# Patient Record
Sex: Female | Born: 1995 | Race: Black or African American | Hispanic: No | Marital: Single | Smoking: Never smoker
Health system: Southern US, Community
[De-identification: ages and names within clinical notes are randomized; demographics above are authoritative.]

## PROBLEM LIST (undated history)

## (undated) ENCOUNTER — Inpatient Hospital Stay (HOSPITAL_COMMUNITY): Payer: Self-pay

## (undated) DIAGNOSIS — Z789 Other specified health status: Secondary | ICD-10-CM

## (undated) DIAGNOSIS — I10 Essential (primary) hypertension: Secondary | ICD-10-CM

## (undated) HISTORY — DX: Essential (primary) hypertension: I10

## (undated) HISTORY — PX: NO PAST SURGERIES: SHX2092

---
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
## 2018-05-23 ENCOUNTER — Other Ambulatory Visit: Payer: Self-pay | Admitting: Family Medicine

## 2018-05-23 ENCOUNTER — Ambulatory Visit (INDEPENDENT_AMBULATORY_CARE_PROVIDER_SITE_OTHER): Payer: BLUE CROSS/BLUE SHIELD | Admitting: Family Medicine

## 2018-05-23 ENCOUNTER — Encounter: Payer: Self-pay | Admitting: Family Medicine

## 2018-05-23 VITALS — BP 110/76 | HR 62 | Temp 98.5°F | Ht 62.0 in | Wt 115.0 lb

## 2018-05-23 DIAGNOSIS — N281 Cyst of kidney, acquired: Secondary | ICD-10-CM | POA: Diagnosis not present

## 2018-05-23 DIAGNOSIS — Z862 Personal history of diseases of the blood and blood-forming organs and certain disorders involving the immune mechanism: Secondary | ICD-10-CM

## 2018-05-23 DIAGNOSIS — H6123 Impacted cerumen, bilateral: Secondary | ICD-10-CM

## 2018-05-23 DIAGNOSIS — Z3009 Encounter for other general counseling and advice on contraception: Secondary | ICD-10-CM

## 2018-05-23 DIAGNOSIS — Z7689 Persons encountering health services in other specified circumstances: Secondary | ICD-10-CM

## 2018-05-23 LAB — CBC
HCT: 30.7 % — ABNORMAL LOW (ref 36.0–46.0)
Hemoglobin: 9.7 g/dL — ABNORMAL LOW (ref 12.0–15.0)
MCHC: 31.4 g/dL (ref 30.0–36.0)
MCV: 78.2 fl (ref 78.0–100.0)
Platelets: 303 10*3/uL (ref 150.0–400.0)
RBC: 3.93 Mil/uL (ref 3.87–5.11)
RDW: 18 % — AB (ref 11.5–14.6)
WBC: 4.8 10*3/uL (ref 4.5–10.5)

## 2018-05-23 LAB — BASIC METABOLIC PANEL
BUN: 9 mg/dL (ref 6–23)
CALCIUM: 9.1 mg/dL (ref 8.4–10.5)
CO2: 26 mEq/L (ref 19–32)
Chloride: 105 mEq/L (ref 96–112)
Creatinine, Ser: 0.64 mg/dL (ref 0.40–1.20)
GFR: 142.63 mL/min (ref >60.00)
Glucose, Bld: 80 mg/dL (ref 70–99)
Potassium: 4.1 mEq/L (ref 3.5–5.1)
Sodium: 138 mEq/L (ref 135–145)

## 2018-05-23 NOTE — Patient Instructions (Signed)
Earwax Buildup, Adult The ears produce a substance called earwax that helps keep bacteria out of the ear and protects the skin in the ear canal. Occasionally, earwax can build up in the ear and cause discomfort or hearing loss. What increases the risk? This condition is more likely to develop in people who:  Are female.  Are elderly.  Naturally produce more earwax.  Clean their ears often with cotton swabs.  Use earplugs often.  Use in-ear headphones often.  Wear hearing aids.  Have narrow ear canals.  Have earwax that is overly thick or sticky.  Have eczema.  Are dehydrated.  Have excess hair in the ear canal. What are the signs or symptoms? Symptoms of this condition include:  Reduced or muffled hearing.  A feeling of fullness in the ear or feeling that the ear is plugged.  Fluid coming from the ear.  Ear pain.  Ear itch.  Ringing in the ear.  Coughing.  An obvious piece of earwax that can be seen inside the ear canal. How is this diagnosed? This condition may be diagnosed based on:  Your symptoms.  Your medical history.  An ear exam. During the exam, your health care provider will look into your ear with an instrument called an otoscope. You may have tests, including a hearing test. How is this treated? This condition may be treated by:  Using ear drops to soften the earwax.  Having the earwax removed by a health care provider. The health care provider may: ? Flush the ear with water. ? Use an instrument that has a loop on the end (curette). ? Use a suction device.  Surgery to remove the wax buildup. This may be done in severe cases. Follow these instructions at home:   Take over-the-counter and prescription medicines only as told by your health care provider.  Do not put any objects, including cotton swabs, into your ear. You can clean the opening of your ear canal with a washcloth or facial tissue.  Follow instructions from your health care  provider about cleaning your ears. Do not over-clean your ears.  Drink enough fluid to keep your urine clear or pale yellow. This will help to thin the earwax.  Keep all follow-up visits as told by your health care provider. If earwax builds up in your ears often or if you use hearing aids, consider seeing your health care provider for routine, preventive ear cleanings. Ask your health care provider how often you should schedule your cleanings.  If you have hearing aids, clean them according to instructions from the manufacturer and your health care provider. Contact a health care provider if:  You have ear pain.  You develop a fever.  You have blood, pus, or other fluid coming from your ear.  You have hearing loss.  You have ringing in your ears that does not go away.  Your symptoms do not improve with treatment.  You feel like the room is spinning (vertigo). Summary  Earwax can build up in the ear and cause discomfort or hearing loss.  The most common symptoms of this condition include reduced or muffled hearing and a feeling of fullness in the ear or feeling that the ear is plugged.  This condition may be diagnosed based on your symptoms, your medical history, and an ear exam.  This condition may be treated by using ear drops to soften the earwax or by having the earwax removed by a health care provider.  Do not put any   objects, including cotton swabs, into your ear. You can clean the opening of your ear canal with a washcloth or facial tissue. This information is not intended to replace advice given to you by your health care provider. Make sure you discuss any questions you have with your health care provider. Document Released: 05/31/2004 Document Revised: 04/04/2017 Document Reviewed: 07/04/2016 Elsevier Interactive Patient Education  2019 [employer].  Contraception Choices Contraception, also called birth control, refers to methods or devices that prevent  pregnancy. Hormonal methods Contraceptive implant  A contraceptive implant is a thin, plastic tube that contains a hormone. It is inserted into the upper part of the arm. It can remain in place for up to 3 years. Progestin-only injections Progestin-only injections are injections of progestin, a synthetic form of the hormone progesterone. They are given every 3 months by a health care provider. Birth control pills  Birth control pills are pills that contain hormones that prevent pregnancy. They must be taken once a day, preferably at the same time each day. Birth control patch  The birth control patch contains hormones that prevent pregnancy. It is placed on the skin and must be changed once a week for three weeks and removed on the fourth week. A prescription is needed to use this method of contraception. Vaginal ring  A vaginal ring contains hormones that prevent pregnancy. It is placed in the vagina for three weeks and removed on the fourth week. After that, the process is repeated with a new ring. A prescription is needed to use this method of contraception. Emergency contraceptive Emergency contraceptives prevent pregnancy after unprotected sex. They come in pill form and can be taken up to 5 days after sex. They work best the sooner they are taken after having sex. Most emergency contraceptives are available without a prescription. This method should not be used as your only form of birth control. Barrier methods Female condom  A female condom is a thin sheath that is worn over the penis during sex. Condoms keep sperm from going inside a woman's body. They can be used with a spermicide to increase their effectiveness. They should be disposed after a single use. Female condom  A female condom is a soft, loose-fitting sheath that is put into the vagina before sex. The condom keeps sperm from going inside a woman's body. They should be disposed after a single use. Diaphragm  A diaphragm is a  soft, dome-shaped barrier. It is inserted into the vagina before sex, along with a spermicide. The diaphragm blocks sperm from entering the uterus, and the spermicide kills sperm. A diaphragm should be left in the vagina for 6-8 hours after sex and removed within 24 hours. A diaphragm is prescribed and fitted by a health care provider. A diaphragm should be replaced every 1-2 years, after giving birth, after gaining more than 15 lb (6.8 kg), and after pelvic surgery. Cervical cap  A cervical cap is a round, soft latex or plastic cup that fits over the cervix. It is inserted into the vagina before sex, along with spermicide. It blocks sperm from entering the uterus. The cap should be left in place for 6-8 hours after sex and removed within 48 hours. A cervical cap must be prescribed and fitted by a health care provider. It should be replaced every 2 years. Sponge  A sponge is a soft, circular piece of polyurethane foam with spermicide on it. The sponge helps block sperm from entering the uterus, and the spermicide kills sperm.  To use it, you make it wet and then insert it into the vagina. It should be inserted before sex, left in for at least 6 hours after sex, and removed and thrown away within 30 hours. Spermicides Spermicides are chemicals that kill or block sperm from entering the cervix and uterus. They can come as a cream, jelly, suppository, foam, or tablet. A spermicide should be inserted into the vagina with an applicator at least 10-15 minutes before sex to allow time for it to work. The process must be repeated every time you have sex. Spermicides do not require a prescription. Intrauterine contraception Intrauterine device (IUD) An IUD is a T-shaped device that is put in a woman's uterus. There are two types:  Hormone IUD.This type contains progestin, a synthetic form of the hormone progesterone. This type can stay in place for 3-5 years.  Copper IUD.This type is wrapped in copper wire. It  can stay in place for 10 years.  Permanent methods of contraception Female tubal ligation In this method, a woman's fallopian tubes are sealed, tied, or blocked during surgery to prevent eggs from traveling to the uterus. Hysteroscopic sterilization In this method, a small, flexible insert is placed into each fallopian tube. The inserts cause scar tissue to form in the fallopian tubes and block them, so sperm cannot reach an egg. The procedure takes about 3 months to be effective. Another form of birth control must be used during those 3 months. Female sterilization This is a procedure to tie off the tubes that carry sperm (vasectomy). After the procedure, the man can still ejaculate fluid (semen). Natural planning methods Natural family planning In this method, a couple does not have sex on days when the woman could become pregnant. Calendar method This means keeping track of the length of each menstrual cycle, identifying the days when pregnancy can happen, and not having sex on those days. Ovulation method In this method, a couple avoids sex during ovulation. Symptothermal method This method involves not having sex during ovulation. The woman typically checks for ovulation by watching changes in her temperature and in the consistency of cervical mucus. Post-ovulation method In this method, a couple waits to have sex until after ovulation. Summary  Contraception, also called birth control, means methods or devices that prevent pregnancy.  Hormonal methods of contraception include implants, injections, pills, patches, vaginal rings, and emergency contraceptives.  Barrier methods of contraception can include female condoms, female condoms, diaphragms, cervical caps, sponges, and spermicides.  There are two types of IUDs (intrauterine devices). An IUD can be put in a woman's uterus to prevent pregnancy for 3-5 years.  Permanent sterilization can be done through a procedure for males,  females, or both.  Natural family planning methods involve not having sex on days when the woman could become pregnant. This information is not intended to replace advice given to you by your health care provider. Make sure you discuss any questions you have with your health care provider. Document Released: 04/23/2005 Document Revised: 04/25/2017 Document Reviewed: 05/26/2016 Elsevier Interactive Patient Education  2019 [employer]

## 2018-05-23 NOTE — Progress Notes (Signed)
Patient presents to clinic today to establish care.  SUBJECTIVE: PMH:  Pt is a 23 yo female with pmh sig for h/o anemia and renal cyst.  Pt was previously seen by her Pediatrician.  H/o renal cyst: -seen of imaging after pt had work up for HTN  [place] [facility] [facility] -pt states during last check everything was stable -she has not had any recent f/u. -denies CP, dysuria, back pain, urinary retention  H/o anemia: -iron deficiency anemia in the past -was on iron supplements -denies chills, fatigue, palpitations  Fertility concerns: -pt inquires if she is able to become pregnant as she feels it should have happened by now. -pt endorses multiple episodes of unprotected sex -using pull out method, but does not wish to become pregnant at this time. -is not on birth control. -Endorses regular menses monthly.  Menses last 5 days. -LMP ended 05/17/18. -Denies increased cramping or heavy bleeding.  Allergies: nkda  Social hx: Pt is single.  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  Pt denies EtOH, tobacco, or drug use.   History reviewed. No pertinent past medical history.  History reviewed. No pertinent surgical history.  No current outpatient medications on file prior to visit.   No current facility-administered medications on file prior to visit.     No Known Allergies  History reviewed. No pertinent family history.  Social History   Socioeconomic History  . Marital status: Single    Spouse name: Not on file  . Number of children: Not on file  . Years of education: Not on file  . Highest education level: Not on file  Occupational History  . Not on file  Social Needs  . Financial resource strain: Not on file  . Food insecurity:    Worry: Not on file    Inability: Not on file  . Transportation needs:    Medical: Not on file    Non-medical: Not on file  Tobacco Use  . Smoking status: Never Smoker  .  Smokeless tobacco: Never Used  Substance and Sexual Activity  . Alcohol use: Never    Frequency: Never  . Drug use: Never  . Sexual activity: Yes  Lifestyle  . Physical activity:    Days per week: Not on file    Minutes per session: Not on file  . Stress: Not on file  Relationships  . Social connections:    Talks on phone: Not on file    Gets together: Not on file    Attends religious service: Not on file    Active member of club or organization: Not on file    Attends meetings of clubs or organizations: Not on file    Relationship status: Not on file  . Intimate partner violence:    Fear of current or ex partner: Not on file    Emotionally abused: Not on file    Physically abused: Not on file    Forced sexual activity: Not on file  Other Topics Concern  . Not on file  Social History Narrative  . Not on file    ROS General: Denies fever, chills, night sweats, changes in weight, changes in appetite HEENT: Denies headaches, ear pain, changes in vision, rhinorrhea, sore throat CV: Denies CP, palpitations, SOB, orthopnea Pulm: Denies SOB, cough, wheezing GI: Denies abdominal pain, nausea, vomiting, diarrhea, constipation GU: Denies dysuria, hematuria, frequency, vaginal discharge Msk: Denies muscle cramps, joint pains Neuro:  Denies weakness, numbness, tingling Skin: Denies rashes, bruising Psych: Denies depression, anxiety, hallucinations  BP 110/76 (BP Location: Left Arm, Patient Position: Sitting, Cuff Size: Normal)   Pulse 62   Temp 98.5 F (36.9 C) (Oral)   Ht 5\' 2"  (1.575 m)   Wt 115 lb (52.2 kg)   LMP 05/17/2018 (Exact Date)   SpO2 97%   BMI 21.03 kg/m   Physical Exam Gen. Pleasant, well developed, well-nourished, in NAD HEENT - [place]/AT, PERRL, no scleral icterus, no nasal drainage Lungs: no use of accessory muscles, CTAB, no wheezes, rales or rhonchi Cardiovascular: RRR, No r/g/m, no peripheral edema Neuro:  A&Ox3, CN II-XII intact, normal gait Skin:  Warm,  dry, intact, no lesions  No results found for this or any previous visit (from the past 2160 hour(s)).  Assessment/Plan: Birth control counseling -Discussed safe sex practices including wearing condoms -Discussed birth control options.  Patient to review options and decide on birth control method -Patient reassured about not becoming pregnant thus far. -Given multiple handouts  Renal cyst, left  -BP controlled.  Not on medication -No recent follow-up imaging -We will obtain labs today. -Consider repeat imaging/follow-up with nephrology. - Plan: CBC (no diff), Basic metabolic panel  Bilateral impacted cerumen -Consent obtained.  Bilateral ears irrigated.  Patient tolerated procedure well. -Given handout -Consider Debrox eardrops.  History of anemia  - Plan: CBC (no diff)  Encounter to establish care -We reviewed the PMH, PSH, FH, SH, Meds and Allergies. -We provided refills for any medications we will prescribe as needed. -We addressed current concerns per orders and patient instructions. -We have asked for records for pertinent exams, studies, vaccines and notes from previous providers. -We have advised patient to follow up per instructions below.  F/u prn for CPE  Abbe [place], MD

## 2018-05-30 ENCOUNTER — Other Ambulatory Visit: Payer: Self-pay | Admitting: Family Medicine

## 2018-05-30 MED ORDER — FERROUS SULFATE 325 (65 FE) MG PO TABS: 325.0000 mg | ORAL_TABLET | Freq: Every day | ORAL | 0 refills | Status: DC

## 2018-06-13 ENCOUNTER — Encounter: Payer: BLUE CROSS/BLUE SHIELD | Admitting: Family Medicine

## 2018-12-24 ENCOUNTER — Encounter: Payer: BLUE CROSS/BLUE SHIELD | Admitting: Family Medicine

## 2019-01-01 ENCOUNTER — Encounter: Payer: Self-pay | Admitting: Family Medicine

## 2019-01-01 ENCOUNTER — Other Ambulatory Visit: Payer: Self-pay

## 2019-01-01 ENCOUNTER — Ambulatory Visit (INDEPENDENT_AMBULATORY_CARE_PROVIDER_SITE_OTHER): Payer: BC Managed Care – PPO | Admitting: Family Medicine

## 2019-01-01 VITALS — BP 110/78 | HR 70 | Temp 98.0°F | Wt 113.0 lb

## 2019-01-01 DIAGNOSIS — Z0000 Encounter for general adult medical examination without abnormal findings: Secondary | ICD-10-CM

## 2019-01-01 NOTE — Progress Notes (Signed)
Subjective:     Jill Peterson is a 23 y.o. female and is here for a comprehensive physical exam. The patient reports no problems.  Pt started a new job, now at [employer].  States the hours are longer so she feels tired.  Otherwise doing well.  Social History   Socioeconomic History  . Marital status: Single    Spouse name: Not on file  . Number of children: Not on file  . Years of education: Not on file  . Highest education level: Not on file  Occupational History  . Not on file  Social Needs  . Financial resource strain: Not on file  . Food insecurity    Worry: Not on file    Inability: Not on file  . Transportation needs    Medical: Not on file    Non-medical: Not on file  Tobacco Use  . Smoking status: Never Smoker  . Smokeless tobacco: Never Used  Substance and Sexual Activity  . Alcohol use: Never    Frequency: Never  . Drug use: Never  . Sexual activity: Yes  Lifestyle  . Physical activity    Days per week: Not on file    Minutes per session: Not on file  . Stress: Not on file  Relationships  . Social Herbalist on phone: Not on file    Gets together: Not on file    Attends religious service: Not on file    Active member of club or organization: Not on file    Attends meetings of clubs or organizations: Not on file    Relationship status: Not on file  . Intimate partner violence    Fear of current or ex partner: Not on file    Emotionally abused: Not on file    Physically abused: Not on file    Forced sexual activity: Not on file  Other Topics Concern  . Not on file  Social History Narrative  . Not on file   Health Maintenance  Topic Date Due  . CHLAMYDIA SCREENING  01/12/2013  . HIV Screening  01/12/2013  . TETANUS/TDAP  01/12/2017  . INFLUENZA VACCINE  12/06/2018    The following portions of the patient's history were reviewed and updated as appropriate: allergies, current medications, past family history, past medical  history, past social history, past surgical history and problem list.  Review of Systems Pertinent items noted in HPI and remainder of comprehensive ROS otherwise negative.   Objective:    BP 110/78 (BP Location: Left Arm, Patient Position: Sitting, Cuff Size: Normal)   Pulse 70   Temp 98 F (36.7 C) (Temporal)   Wt 113 lb (51.3 kg)   LMP 12/26/2018 (Exact Date)   SpO2 98%   BMI 20.67 kg/m  General appearance: alert, cooperative and no distress Head: Normocephalic, without obvious abnormality, atraumatic Eyes: conjunctivae/corneas clear. PERRL, EOM's intact. Fundi benign. Ears: normal TM's and external ear canals both ears Nose: Nares normal. Septum midline. Mucosa normal. No drainage or sinus tenderness. Throat: lips, mucosa, and tongue normal; teeth and gums normal Neck: no adenopathy, no carotid bruit, no JVD, supple, symmetrical, trachea midline and thyroid not enlarged, symmetric, no tenderness/mass/nodules Lungs: clear to auscultation bilaterally Heart: regular rate and rhythm, S1, S2 normal, no murmur, click, rub or gallop Abdomen: soft, non-tender; bowel sounds normal; no masses,  no organomegaly Extremities: extremities normal, atraumatic, no cyanosis or edema Pulses: 2+ and symmetric Skin: Skin color, texture, turgor normal. No rashes  or lesions Lymph nodes: Cervical, supraclavicular, and axillary nodes normal. Neurologic: Alert and oriented X 3, normal strength and tone. Normal symmetric reflexes. Normal coordination and gait    Assessment:    Healthy female exam.      Plan:     Anticipatory guidance given including wearing seatbelts, smoke detectors in the home, increasing physical activity, increasing p.o. intake of water and vegetables. -labs from 05/2018 reviewed -pap-not yet indicated -given handout -offer influenza vaccine. -next CPE in 1 yr See After Visit Summary for Counseling Recommendations    F/u prn  Abbe AmsterdamShannon Nathifa Ritthaler, MD

## 2019-01-01 NOTE — Patient Instructions (Signed)
Preventive Care 18-21 Years Old, Female Preventive care refers to lifestyle choices and visits with your health care provider that can promote health and wellness. At this stage in your life, you may start seeing a primary care physician instead of a pediatrician. Your health care is now your responsibility. Preventive care for young adults includes:  A yearly physical exam. This is also called an annual wellness visit.  Regular dental and eye exams.  Immunizations.  Screening for certain conditions.  Healthy lifestyle choices, such as diet and exercise. What can I expect for my preventive care visit? Physical exam Your health care provider may check:  Height and weight. These may be used to calculate body mass index (BMI), which is a measurement that tells if you are at a healthy weight.  Heart rate and blood pressure.  Body temperature. Counseling Your health care provider may ask you questions about:  Past medical problems and family medical history.  Alcohol, tobacco, and drug use.  Home and relationship well-being.  Access to firearms.  Emotional well-being.  Diet, exercise, and sleep habits.  Sexual activity and sexual health.  Method of birth control.  Menstrual cycle.  Pregnancy history. What immunizations do I need?  Influenza (flu) vaccine  This is recommended every year. Tetanus, diphtheria, and pertussis (Tdap) vaccine  You may need a Td booster every 10 years. Varicella (chickenpox) vaccine  You may need this vaccine if you have not already been vaccinated. Human papillomavirus (HPV) vaccine  If recommended by your health care provider, you may need three doses over 6 months. Measles, mumps, and rubella (MMR) vaccine  You may need at least one dose of MMR. You may also need a second dose. Meningococcal conjugate (MenACWY) vaccine  One dose is recommended if you are 19-21 years old and a first-year college student living in a residence hall,  or if you have one of several medical conditions. You may also need additional booster doses. Pneumococcal conjugate (PCV13) vaccine  You may need this if you have certain conditions and were not previously vaccinated. Pneumococcal polysaccharide (PPSV23) vaccine  You may need one or two doses if you smoke cigarettes or if you have certain conditions. Hepatitis A vaccine  You may need this if you have certain conditions or if you travel or work in places where you may be exposed to hepatitis A. Hepatitis B vaccine  You may need this if you have certain conditions or if you travel or work in places where you may be exposed to hepatitis B. Haemophilus influenzae type b (Hib) vaccine  You may need this if you have certain risk factors. You may receive vaccines as individual doses or as more than one vaccine together in one shot (combination vaccines). Talk with your health care provider about the risks and benefits of combination vaccines. What tests do I need? Blood tests  Lipid and cholesterol levels. These may be checked every 5 years starting at age 20.  Hepatitis C test.  Hepatitis B test. Screening  Pelvic exam and Pap test. This may be done every 3 years starting at age 21.  Sexually transmitted disease (STD) testing, if you are at risk.  BRCA-related cancer screening. This may be done if you have a family history of breast, ovarian, tubal, or peritoneal cancers. Other tests  Tuberculosis skin test.  Vision and hearing tests.  Skin exam.  Breast exam. Follow these instructions at home: Eating and drinking   Eat a diet that includes fresh fruits and   vegetables, whole grains, lean protein, and low-fat dairy products.  Drink enough fluid to keep your urine pale yellow.  Do not drink alcohol if: ? Your health care provider tells you not to drink. ? You are pregnant, may be pregnant, or are planning to become pregnant. ? You are under the legal drinking age. In the  [place] the legal drinking age is 21.  If you drink alcohol: ? Limit how much you have to 0-1 drink a day. ? Be aware of how much alcohol is in your drink. In the [place] one drink equals one 12 oz bottle of beer (355 mL), one 5 oz glass of wine (148 mL), or one 1 oz glass of hard liquor (44 mL). Lifestyle  Take daily care of your teeth and gums.  Stay active. Exercise at least 30 minutes 5 or more days of the week.  Do not use any products that contain nicotine or tobacco, such as cigarettes, e-cigarettes, and chewing tobacco. If you need help quitting, ask your health care provider.  Do not use drugs.  If you are sexually active, practice safe sex. Use a condom or other form of birth control (contraception) in order to prevent pregnancy and STIs (sexually transmitted infections). If you plan to become pregnant, see your health care provider for a pre-conception visit.  Find healthy ways to cope with stress, such as: ? Meditation, yoga, or listening to music. ? Journaling. ? Talking to a trusted person. ? Spending time with friends and family. Safety  Always wear your seat belt while driving or riding in a vehicle.  Do not drive if you have been drinking alcohol. Do not ride with someone who has been drinking.  Do not drive when you are tired or distracted. Do not text while driving.  Wear a helmet and other protective equipment during sports activities.  If you have firearms in your house, make sure you follow all gun safety procedures.  Seek help if you have been bullied, physically abused, or sexually abused.  Use the Internet responsibly to avoid dangers such as online bullying and online sex predators. What's next?  Go to your health care provider once a year for a well check visit.  Ask your health care provider how often you should have your eyes and teeth checked.  Stay up to date on all vaccines. This information is not intended to replace advice given to you by  your health care provider. Make sure you discuss any questions you have with your health care provider. Document Released: 09/08/2015 Document Revised: 04/17/2018 Document Reviewed: 04/17/2018 Elsevier Patient Education  2020 [employer] Inc[employer]  

## 2019-06-05 ENCOUNTER — Telehealth: Payer: Self-pay | Admitting: Family Medicine

## 2019-06-05 NOTE — Telephone Encounter (Signed)
Pt would like to discuss something that is going on with her privately with CMA; she wants some advice if she should schedule a visit with Dr. Salomon Fick. the patient phone number is   [phone]

## 2019-06-08 ENCOUNTER — Telehealth (INDEPENDENT_AMBULATORY_CARE_PROVIDER_SITE_OTHER): Payer: BC Managed Care – PPO | Admitting: Family Medicine

## 2019-06-08 DIAGNOSIS — N979 Female infertility, unspecified: Secondary | ICD-10-CM | POA: Diagnosis not present

## 2019-06-08 NOTE — Telephone Encounter (Signed)
Pt is scheduled for a virtual visit this afternoon with Dr Banks 

## 2019-06-08 NOTE — Progress Notes (Signed)
Virtual Visit via Video Note  I connected with Providence Crosby on 06/08/19 at  3:30 PM EST by a video enabled telemedicine application 2/2 COVID-19 pandemic and verified that I am speaking with the correct person using two identifiers.  Location patient: in [employer] Location provider:work or home office Persons participating in the virtual visit: patient, provider  I discussed the limitations of evaluation and management by telemedicine and the availability of in person appointments. The patient expressed understanding and agreed to proceed.   HPI: Pt is a 24 yo female with pmh sig for HTN.  Pt seen for ongoing concern.  Pt's LMP was 05/28/19, lasted 5 days.  Pt notes menses was at the beginning of the month but recently started occurring towards the end of the month.  Pt states she should be pregnant by now and wonders why she is not.  Pt states if she were to get pregnant she would be ok with it.  Pt is not using any protection or birth control.  Pt denies vaginal d/c, abd pain, AUB, n/v, palpitations.  Pt taking doxycycline and a cream for ance, but denies any new supplements.     ROS: See pertinent positives and negatives per HPI.  Past Medical History:  Diagnosis Date  . Hypertension     No past surgical history on file.  No family history on file.   Current Outpatient Medications:  .  ferrous sulfate 325 (65 FE) MG tablet, Take 1 tablet (325 mg total) by mouth daily with breakfast., Disp: 30 tablet, Rfl: 0  EXAM:   VITALS per patient if applicable:  RR between 12-20 bpm  GENERAL: alert, oriented, appears well and in no acute distress  HEENT: atraumatic, conjunctiva clear, no obvious abnormalities on inspection of external nose and ears  NECK: normal movements of the head and neck  LUNGS: on inspection no signs of respiratory distress, breathing rate appears normal, no obvious gross SOB, gasping or wheezing  CV: no obvious cyanosis  MS: moves all visible extremities  without noticeable abnormality  PSYCH/NEURO: pleasant and cooperative, no obvious depression or anxiety, speech and thought processing grossly intact  ASSESSMENT AND PLAN:  Discussed the following assessment and plan:  Female fertility concern -attempted to review window of conception and menses info with pt -pt advised to make appt with OB/Gyn for further evaluation  F/u prn   I discussed the assessment and treatment plan with the patient. The patient was provided an opportunity to ask questions and all were answered. The patient agreed with the plan and demonstrated an understanding of the instructions.   The patient was advised to call back or seek an in-person evaluation if the symptoms worsen or if the condition fails to improve as anticipated.   Deeann Saint, MD

## 2020-09-22 ENCOUNTER — Encounter: Payer: BC Managed Care – PPO | Admitting: Family Medicine

## 2022-01-30 NOTE — Progress Notes (Unsigned)
ACUTE VISIT Chief Complaint  Patient presents with   Ear Pain    Started after her recent cold/sinus infection from last week. Right ear, was aching, now she has low hearing in that ear. Unsure if it's fluid, it does pop.   HPI: Jill Peterson is a 26 y.o. female, who is here today complaining of right ear fullness sensation and hearing loss. She had an URI a few days ago, symptoms have resolved. She had  right earache until Tuesday. Took Ibuprofen.  Ear Fullness  There is pain in the right ear. This is a new problem. The current episode started 1 to 4 weeks ago. The problem occurs constantly. There has been no fever. The patient is experiencing no pain. Associated symptoms include hearing loss and rhinorrhea. Pertinent negatives include no abdominal pain, coughing, diarrhea, ear discharge, headaches, neck pain, rash, sore throat or vomiting. She has tried NSAIDs for the symptoms. The treatment provided mild relief. There is no history of a chronic ear infection.  Temporarily improved when her ear does "pop." Negative for associated headache, dizziness, or tinnitus.  Review of Systems  Constitutional:  Negative for activity change, appetite change and fatigue.  HENT:  Positive for hearing loss and rhinorrhea. Negative for ear discharge and sore throat.   Respiratory:  Negative for cough, shortness of breath and wheezing.   Gastrointestinal:  Negative for abdominal pain, diarrhea and vomiting.  Musculoskeletal:  Negative for neck pain.  Skin:  Negative for rash.  Neurological:  Negative for headaches.  Hematological:  Negative for adenopathy. Does not bruise/bleed easily.  Rest see pertinent positives and negatives per HPI.  Current Outpatient Medications on File Prior to Visit  Medication Sig Dispense Refill   ferrous sulfate 325 (65 FE) MG tablet Take 1 tablet (325 mg total) by mouth daily with breakfast. 30 tablet 0   No current facility-administered medications on file  prior to visit.   Past Medical History:  Diagnosis Date   Hypertension    No Known Allergies  Social History   Socioeconomic History   Marital status: Single    Spouse name: Not on file   Number of children: Not on file   Years of education: Not on file   Highest education level: Not on file  Occupational History   Not on file  Tobacco Use   Smoking status: Never   Smokeless tobacco: Never  Substance and Sexual Activity   Alcohol use: Never   Drug use: Never   Sexual activity: Yes  Other Topics Concern   Not on file  Social History Narrative   Not on file   Social Determinants of Health   Financial Resource Strain: Not on file  Food Insecurity: Not on file  Transportation Needs: Not on file  Physical Activity: Not on file  Stress: Not on file  Social Connections: Not on file   Vitals:   01/31/22 1216  BP: 118/70  Pulse: 75  Resp: 12  Temp: 98.7 F (37.1 C)  SpO2: 99%  Body mass index is 20.74 kg/m.  Physical Exam Vitals and nursing note reviewed.  Constitutional:      General: She is not in acute distress.    Appearance: She is well-developed and well-groomed. She is not ill-appearing.  HENT:     Head: Normocephalic and atraumatic.     Right Ear: Hearing and external ear normal. No tenderness. A middle ear effusion is present. No mastoid tenderness. Tympanic membrane is erythematous (Mild and localized.) and bulging (  slightly).     Left Ear: Hearing, tympanic membrane, ear canal and external ear normal.     Nose: Rhinorrhea present. No congestion.     Right Sinus: No maxillary sinus tenderness or frontal sinus tenderness.     Left Sinus: No maxillary sinus tenderness or frontal sinus tenderness.     Mouth/Throat:     Mouth: Mucous membranes are moist.     Pharynx: Oropharynx is clear.  Eyes:     Conjunctiva/sclera: Conjunctivae normal.  Cardiovascular:     Rate and Rhythm: Normal rate and regular rhythm.     Heart sounds: No murmur  heard. Pulmonary:     Effort: Pulmonary effort is normal. No respiratory distress.     Breath sounds: Normal breath sounds. No stridor.  Lymphadenopathy:     Head:     Right side of head: No submandibular adenopathy.     Left side of head: No submandibular adenopathy.     Cervical: No cervical adenopathy.  Skin:    General: Skin is warm.     Findings: No erythema or rash.  Neurological:     Mental Status: She is alert and oriented to person, place, and time.  Psychiatric:        Mood and Affect: Mood is anxious.   ASSESSMENT AND PLAN:  Jill Peterson was seen today for ear pain.  Diagnoses and all orders for this visit:  Non-recurrent acute serous otitis media of right ear Earache has resolved, most likely viral and resolving. I do not think abx is needed at this time, she can start if earache reoccurs. Instructed about warning signs.  -     amoxicillin-clavulanate (AUGMENTIN) 875-125 MG tablet; Take 1 tablet by mouth 2 (two) times daily for 7 days.  Dysfunction of right eustachian tube We discussed Dx and treatment options. Symptoms may last a few weeks. Autoinflation maneuvers a few times during the day and OTC decongestants may help.  Hearing Screening   500Hz  1000Hz  2000Hz  4000Hz   Right ear Pass Pass Pass Pass  Left ear Pass Pass Pass Pass   Return if symptoms worsen or fail to improve.  Jill Peterson G. , MD  [facility]. [place] office.

## 2022-01-31 ENCOUNTER — Ambulatory Visit (INDEPENDENT_AMBULATORY_CARE_PROVIDER_SITE_OTHER): Payer: BLUE CROSS/BLUE SHIELD | Admitting: Family Medicine

## 2022-01-31 ENCOUNTER — Encounter: Payer: Self-pay | Admitting: Family Medicine

## 2022-01-31 VITALS — BP 118/70 | HR 75 | Temp 98.7°F | Resp 12 | Ht 62.0 in | Wt 113.4 lb

## 2022-01-31 DIAGNOSIS — H6981 Other specified disorders of Eustachian tube, right ear: Secondary | ICD-10-CM | POA: Diagnosis not present

## 2022-01-31 DIAGNOSIS — H6501 Acute serous otitis media, right ear: Secondary | ICD-10-CM | POA: Diagnosis not present

## 2022-01-31 MED ORDER — AMOXICILLIN-POT CLAVULANATE 875-125 MG PO TABS: 1.0000 | ORAL_TABLET | Freq: Two times a day (BID) | ORAL | 0 refills | Status: AC

## 2022-01-31 NOTE — Patient Instructions (Addendum)
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 If sudden hearing loss, please seek immediate medical attention. I do not think you need antibiotic at this time, ear redness is mild but it earache starts again, start taking antibiotic.  If you need refills for medications you take chronically, please call your pharmacy. Do not use My Chart to request refills or for acute issues that need immediate attention. If you send a my chart message, it may take a few days to be addressed, specially if I am not in the office.  Please be sure medication list is accurate. If a new problem present, please set up appointment sooner than planned today.

## 2024-02-17 ENCOUNTER — Other Ambulatory Visit (HOSPITAL_BASED_OUTPATIENT_CLINIC_OR_DEPARTMENT_OTHER)

## 2024-02-27 ENCOUNTER — Ambulatory Visit: Admitting: Family Medicine

## 2024-02-27 ENCOUNTER — Ambulatory Visit: Payer: Self-pay

## 2024-02-27 NOTE — Telephone Encounter (Signed)
Patient has appt 10/23

## 2024-02-27 NOTE — Telephone Encounter (Signed)
 FYI Only or Action Required?: Action required by provider: update on patient condition.  Patient was last seen in primary care on 01/31/2022 by [place], Betty G, MD.  Called Nurse Triage reporting Abdominal Pain.  Symptoms began today.  Interventions attempted: OTC medications: ibuprofen , Rest, hydration, or home remedies, and Ice/heat application.  Symptoms are: gradually worsening.  Triage Disposition: See HCP Within 4 Hours (Or PCP Triage)  Patient/caregiver understands and will follow disposition?: Yes  Copied from CRM #[id number]. Topic: Clinical - Medical Advice >> Feb 27, 2024 10:18 AM Maisie C wrote: Reason for CRM: Menstrual bleeding for 3 weeks, extreme lower abdominal pain, classified pain 10/10. Hard to walk and stand. Reason for Disposition  [1] MILD-MODERATE pain AND [2] constant AND [3] present > 2 hours  Answer Assessment - Initial Assessment Questions Menstrual bleeding off and on for 3 weeks- stopped 3 days ago but started back up again today. Sharp abdominal pain 10/10 one episode that woke her at 3am and another at 9 today that is persisting. Heating pad and ibuprofen with no relief. Had to leave work early. Laying down now and still with Sharp stabbing pain. Different from any menstrual cramps. Tried to eat something and almost threw it back up from nausea.   Hx of ovarian cyst- freshman yr of high school- wasn't in pain at that time, but had HTN issues.  Discussed options with patient. Determined she will go into the ED for evaluation. They have the most equipment and testing options that can be completed to determine possible cause and treatment. She agrees. Pt to call back for FU appt.   1. LOCATION: Where does it hurt?      Below belly button above pubic bone. Right in the middle  2. RADIATION: Does the pain shoot anywhere else? (e.g., chest, back)     Denies  3. ONSET: When did the pain begin? (e.g., minutes, hours or days ago)      This AM  4. SUDDEN:  Gradual or sudden onset?     Sudden stabbing pain  5. PATTERN Does the pain come and go, or is it constant?     Intermittent  6. SEVERITY: How bad is the pain?  (e.g., Scale 1-10; mild, moderate, or severe)     10/10 7. RECURRENT SYMPTOM: Have you ever had this type of stomach pain before? If Yes, ask: When was the last time? and What happened that time?      no 8. CAUSE: What do you think is causing the stomach pain? (e.g., gallstones, recent abdominal surgery)     unsure 9. RELIEVING/AGGRAVATING FACTORS: What makes it better or worse? (e.g., antacids, bending or twisting motion, bowel movement)     No relief so far 10. OTHER SYMPTOMS: Do you have any other symptoms? (e.g., back pain, diarrhea, fever, urination pain, vomiting)       Nausea, abnormal menstrual bleeding  11. PREGNANCY: Is there any chance you are pregnant? When was your last menstrual period?       Unsure- she hopes not- period always irregular  Protocols used: Abdominal Pain - Female-A-AH

## 2024-02-28 ENCOUNTER — Inpatient Hospital Stay (HOSPITAL_BASED_OUTPATIENT_CLINIC_OR_DEPARTMENT_OTHER)
Admission: EM | Admit: 2024-02-28 | Discharge: 2024-02-28 | Disposition: A | Discharge status: Home / Self Care | Payer: Self-pay | Attending: Emergency Medicine | Admitting: Emergency Medicine

## 2024-02-28 ENCOUNTER — Other Ambulatory Visit: Payer: Self-pay

## 2024-02-28 ENCOUNTER — Emergency Department (HOSPITAL_BASED_OUTPATIENT_CLINIC_OR_DEPARTMENT_OTHER): Payer: Self-pay

## 2024-02-28 ENCOUNTER — Encounter (HOSPITAL_BASED_OUTPATIENT_CLINIC_OR_DEPARTMENT_OTHER): Payer: Self-pay | Admitting: Emergency Medicine

## 2024-02-28 DIAGNOSIS — O009 Unspecified ectopic pregnancy without intrauterine pregnancy: Secondary | ICD-10-CM | POA: Insufficient documentation

## 2024-02-28 DIAGNOSIS — Z3A01 Less than 8 weeks gestation of pregnancy: Secondary | ICD-10-CM

## 2024-02-28 DIAGNOSIS — Z79631 Long term (current) use of antimetabolite agent: Secondary | ICD-10-CM

## 2024-02-28 DIAGNOSIS — O00101 Right tubal pregnancy without intrauterine pregnancy: Secondary | ICD-10-CM

## 2024-02-28 HISTORY — DX: Other specified health status: Z78.9

## 2024-02-28 LAB — URINALYSIS, ROUTINE W REFLEX MICROSCOPIC
Bilirubin Urine: NEGATIVE
Glucose, UA: NEGATIVE mg/dL
Ketones, ur: NEGATIVE mg/dL
Leukocytes,Ua: NEGATIVE
Nitrite: NEGATIVE
Protein, ur: NEGATIVE mg/dL
Specific Gravity, Urine: 1.022 (ref 1.005–1.030)
pH: 7.5 (ref 5.0–8.0)

## 2024-02-28 LAB — CBC
HCT: 34 % — ABNORMAL LOW (ref 36.0–46.0)
Hemoglobin: 11.3 g/dL — ABNORMAL LOW (ref 12.0–15.0)
MCH: 30.2 pg (ref 26.0–34.0)
MCHC: 33.2 g/dL (ref 30.0–36.0)
MCV: 90.9 fL (ref 80.0–100.0)
Platelets: 275 K/uL (ref 150–400)
RBC: 3.74 MIL/uL — ABNORMAL LOW (ref 3.87–5.11)
RDW: 12.7 % (ref 11.5–15.5)
WBC: 5.9 K/uL (ref 4.0–10.5)
nRBC: 0 % (ref 0.0–0.2)

## 2024-02-28 LAB — COMPREHENSIVE METABOLIC PANEL WITH GFR
ALT: 12 U/L (ref 0–44)
AST: 30 U/L (ref 15–41)
Albumin: 4.4 g/dL (ref 3.5–5.0)
Alkaline Phosphatase: 60 U/L (ref 38–126)
Anion gap: 10 (ref 5–15)
BUN: 8 mg/dL (ref 6–20)
CO2: 23 mmol/L (ref 22–32)
Calcium: 9.9 mg/dL (ref 8.9–10.3)
Chloride: 103 mmol/L (ref 98–111)
Creatinine, Ser: 0.72 mg/dL (ref 0.44–1.00)
GFR, Estimated: >60 mL/min (ref >60)
Glucose, Bld: 87 mg/dL (ref 70–99)
Potassium: 4.5 mmol/L (ref 3.5–5.1)
Sodium: 135 mmol/L (ref 135–145)
Total Bilirubin: 0.5 mg/dL (ref 0.0–1.2)
Total Protein: 7.9 g/dL (ref 6.5–8.1)

## 2024-02-28 LAB — WET PREP, GENITAL
Clue Cells Wet Prep HPF POC: NONE SEEN
Sperm: NONE SEEN
Trich, Wet Prep: NONE SEEN
WBC, Wet Prep HPF POC: >=10 — AB (ref <10)
Yeast Wet Prep HPF POC: NONE SEEN

## 2024-02-28 LAB — LIPASE, BLOOD: Lipase: 23 U/L (ref 11–51)

## 2024-02-28 LAB — HCG, QUANTITATIVE, PREGNANCY: hCG, Beta Chain, Quant, S: 2286 m[IU]/mL — ABNORMAL HIGH (ref <5)

## 2024-02-28 LAB — PREGNANCY, URINE: Preg Test, Ur: POSITIVE — AB

## 2024-02-28 MED ORDER — METHOTREXATE FOR ECTOPIC PREGNANCY
50.0000 mg/m2 | Freq: Once | INTRAMUSCULAR | Status: AC
  Administered 2024-02-28: 80 mg via INTRAMUSCULAR
  Filled 2024-02-28: qty 3.2

## 2024-02-28 NOTE — ED Triage Notes (Signed)
 Lower abdo pain x 2 days Menstrual bleeding x 3 weeks, intermittent  Today took otc preg test -unsure results

## 2024-02-28 NOTE — ED Notes (Signed)
 Called Kim at [facility] for transport to [facility]-Dr. Fredirick

## 2024-02-28 NOTE — MA[facility] Note (Signed)
 Pt transferred from [facility] with dx of ectopic pregnancy. Pt A/A/Ox4. Rep[orts some feeling of fullness in her lowe abd but no pain .  V/SS.

## 2024-02-28 NOTE — ED Provider Notes (Signed)
 [place] EMERGENCY DEPARTMENT AT [facility] Provider Note   CSN: [id number] Arrival date & time: 02/28/24  1412     Patient presents with: Abdominal Pain   Jill Peterson is a 28 y.o. female.   Patient is here with abdominal pain concern for pregnancy.  She has been having some irregular menstrual bleeding here in the last several weeks.  She is having pain mostly in the suprapubic area.  She denies any nausea vomiting diarrhea.  No constipation.  No pain with urination.  Is not on any birth control.  She is not having any chest pain shortness of breath.  No vaginal discharge.  The history is provided by the patient.       Prior to Admission medications   Medication Sig Start Date End Date Taking? Authorizing Provider  ferrous sulfate  325 (65 FE) MG tablet Take 1 tablet (325 mg total) by mouth daily with breakfast. 05/30/18   Mercer Clotilda SAUNDERS, MD    Allergies: Patient has no known allergies.    Review of Systems  Updated Vital Signs BP (!) 149/100   Pulse 86   Temp 98.4 F (36.9 C) (Oral)   Resp 16   LMP 01/14/2024   SpO2 100%   Physical Exam Vitals and nursing note reviewed.  Constitutional:      General: She is not in acute distress.    Appearance: She is well-developed.  HENT:     Head: Normocephalic and atraumatic.  Eyes:     Extraocular Movements: Extraocular movements intact.     Conjunctiva/sclera: Conjunctivae normal.     Pupils: Pupils are equal, round, and reactive to light.  Cardiovascular:     Rate and Rhythm: Normal rate and regular rhythm.     Heart sounds: Normal heart sounds. No murmur heard. Pulmonary:     Effort: Pulmonary effort is normal. No respiratory distress.     Breath sounds: Normal breath sounds.  Abdominal:     Palpations: Abdomen is soft.     Tenderness: There is abdominal tenderness in the suprapubic area.  Musculoskeletal:        General: No swelling.     Cervical back: Neck supple.  Skin:    General:  Skin is warm and dry.     Capillary Refill: Capillary refill takes less than 2 seconds.  Neurological:     Mental Status: She is alert.  Psychiatric:        Mood and Affect: Mood normal.     (all labs ordered are listed, but only abnormal results are displayed) Labs Reviewed  CBC - Abnormal; Notable for the following components:      Result Value   RBC 3.74 (*)    Hemoglobin 11.3 (*)    HCT 34.0 (*)    All other components within normal limits  URINALYSIS, ROUTINE W REFLEX MICROSCOPIC - Abnormal; Notable for the following components:   Hgb urine dipstick LARGE (*)    Bacteria, UA RARE (*)    All other components within normal limits  PREGNANCY, URINE - Abnormal; Notable for the following components:   Preg Test, Ur POSITIVE (*)    All other components within normal limits  WET PREP, GENITAL  LIPASE, BLOOD  COMPREHENSIVE METABOLIC PANEL WITH GFR  HCG, QUANTITATIVE, PREGNANCY  ABO/RH  GC/CHLAMYDIA PROBE AMP ([place]) NOT AT [facility]    EKG: None  Radiology: US  OB LESS THAN 14 WEEKS WITH OB TRANSVAGINAL Result Date: 02/28/2024 CLINICAL DATA:  Vaginal bleeding EXAM: OBSTETRIC <  14 WK US  AND TRANSVAGINAL OB US  TECHNIQUE: Both transabdominal and transvaginal ultrasound examinations were performed for complete evaluation of the gestation as well as the maternal uterus, adnexal regions, and pelvic cul-de-sac. Transvaginal technique was performed to assess early pregnancy. COMPARISON:  None Available. FINDINGS: Intrauterine gestational sac: None Yolk sac:  Not Visualized. Embryo:  Not Visualized. Maternal uterus/adnexae: Heterogenous echogenic soft tissue mass in the right adnexa, appears to be abutting the right ovary rather than arising from it, this measures 3 x 2.5 x 3.1 cm and contains peripheral vascularity. Small volume free fluid. IMPRESSION: No IUP identified. Heterogenous 3.1 cm soft tissue mass in the right adnexa abutting the right ovary with imaging features suspicious for  right adnexal ectopic pregnancy. Critical Value/emergent results were called by telephone at the time of interpretation on 02/28/2024 at 6:03 pm to provider Izaia Say , who verbally acknowledged these results. Electronically Signed   By: Luke Bun M.D.   On: 02/28/2024 18:03     .Critical Care  Performed by: Ruthe Cornet, DO Authorized by: Ruthe Cornet, DO   Critical care provider statement:    Critical care time (minutes):  35   Critical care was necessary to treat or prevent imminent or life-threatening deterioration of the following conditions: ectopic pregnancy.   Critical care was time spent personally by me on the following activities:  Blood draw for specimens, development of treatment plan with patient or surrogate, discussions with primary provider, evaluation of patient's response to treatment, examination of patient, obtaining history from patient or surrogate, ordering and review of radiographic studies, pulse oximetry, re-evaluation of patient's condition, review of old charts, ordering and performing treatments and interventions and ordering and review of laboratory studies   Care discussed with: admitting provider      Medications Ordered in the ED - No data to display                                  Medical Decision Making Amount and/or Complexity of Data Reviewed Labs: ordered. Radiology: ordered.   Jill Peterson is here with abdominal pain and some irregular vaginal bleeding here the last several weeks.  Unremarkable vitals.  No fever.  She took a home pregnancy test but did not know what the results were.  Overall she is tender in the suprapubic region.  Differential diagnosis could be fibroid versus abnormal uterine bleeding versus less likely ovarian torsion or cyst.  Could be UTI constipation and seems less likely to be appendicitis.  Will check CBC CMP lipase urinalysis pregnancy test and reevaluate.  She is not having any  discharge.  Pregnancy test is positive.  Did obtain ultrasound that did show no identifiable IUP and did show heterogeneous 3.1 cm soft tissue mass in the right adnexa abutting the right ovary with imaging features suspicious for right adnexal ectopic pregnancy.  I already done a pelvic exam and she had a closed uterus.  Lab work is unremarkable.  She has no significant leukocytosis anemia or electrolyte abnormality.  Thankfully her hemodynamics are stable.  Her blood pressure is 149/100.  Pulse rate is 86.  I talked with Dr. Fredirick with OB/GYN team and accepts the patient in transfer to [facility] for further care.  She stable at this time.  This chart was dictated using voice recognition software.  Despite best efforts to proofread,  errors can occur which can change the documentation meaning.  Final diagnoses:  Ectopic pregnancy, unspecified location, unspecified whether intrauterine pregnancy present    ED Discharge Orders     None          Ruthe Cornet, DO 02/28/24 1817

## 2024-02-28 NOTE — Discharge Instructions (Signed)
-  Reviewed Methotrexate treatment including: *Risks of methotrexate including failure requiring repeat dosing or eventual surgery.  *Side effects of photosensitivity & GI upset were discussed. Encouraged to avoid direct sunlight and abstain from alcohol for two weeks and avoid unprotected sexual intercourse until hCG level returns to non pregnant state .  *Instructed to discontinue any MVI with and decrease nutritional intake of folic acid . *Need for frequent return visits to monitor lab values. *Risk for ectopic rupture.  -?She understands to follow up on D4 (Monday) and D7 (Thursday) for repeat BHCG and was the instruction sheet was placed in her AVS.  Day 0/1 Day 4 Day 7  Sunday Wednesday Saturday  Monday Thursday Sunday  Tuesday Friday Monday  Wednesday Saturday Tuesday  Thursday Sunday Wednesday  Friday Monday Thursday  Saturday Tuesday Friday

## 2024-02-28 NOTE — MAU Provider Note (Addendum)
 History     CSN: [id number]  Arrival date and time: 02/28/24 1412   Event Date/Time   First Provider Initiated Contact with Patient 02/28/24 2051      Chief Complaint  Patient presents with   Abdominal Pain    Jill Peterson is a 28 y.o. G1P0 at [redacted]w[redacted]d by Definite LMP of Sept 9, 2025.  She presents today for vaginal bleeding for the past 3 weeks.  She states she thought she was having a period, at the beginning, but continued to bleed after her typical 5 days.  She states 3 Thursdays ago she had a sharp pain that was too painful to sit, walk, stand.  She states the pain lasted hours and was unrelieved with OTC medications.  She states the same pain occurred yesterday. She states she is still having light bleeding and no pain currently.    OB History     Gravida  1   Para      Term      Preterm      AB      Living         SAB      IAB      Ectopic      Multiple      Live Births              Past Medical History:  Diagnosis Date   Medical history non-contributory     Past Surgical History:  Procedure Laterality Date   NO PAST SURGERIES      History reviewed. No pertinent family history.  Social History   Tobacco Use   Smoking status: Never   Smokeless tobacco: Never  Vaping Use   Vaping status: Never Used  Substance Use Topics   Alcohol use: Yes    Comment: few drinks a month last was 2 weeks ago   Drug use: Never    Allergies: No Known Allergies  Medications Prior to Admission  Medication Sig Dispense Refill Last Dose/Taking   ferrous sulfate  325 (65 FE) MG tablet Take 1 tablet (325 mg total) by mouth daily with breakfast. 30 tablet 0     Review of Systems  Gastrointestinal:  Negative for abdominal pain, constipation, diarrhea, nausea and vomiting.  Genitourinary:  Positive for vaginal bleeding. Negative for difficulty urinating, dysuria and vaginal discharge.   Physical Exam   Blood pressure 138/83, pulse 84,  temperature 98.4 F (36.9 C), temperature source Oral, resp. rate 18, last menstrual period 01/14/2024, SpO2 100%.  Physical Exam Vitals and nursing note reviewed.  Constitutional:      Appearance: Normal appearance. She is well-developed.  HENT:     Head: Normocephalic and atraumatic.  Eyes:     Conjunctiva/sclera: Conjunctivae normal.  Cardiovascular:     Rate and Rhythm: Normal rate.     Heart sounds: Normal heart sounds.  Pulmonary:     Effort: Pulmonary effort is normal. No respiratory distress.     Breath sounds: Normal breath sounds.  Abdominal:     General: Bowel sounds are normal.     Palpations: Abdomen is soft.     Tenderness: There is abdominal tenderness (Mild) in the left lower quadrant. There is no guarding.  Musculoskeletal:        General: Normal range of motion.     Cervical back: Normal range of motion.  Skin:    General: Skin is warm and dry.  Neurological:     Mental Status: She is alert and oriented  to person, place, and time.  Psychiatric:        Mood and Affect: Mood normal.        Behavior: Behavior normal.     MAU Course  Procedures Results for orders placed or performed during the hospital encounter of 02/28/24 (from the past 24 hours)  Lipase, blood     Status: None   Collection Time: 02/28/24  2:34 PM  Result Value Ref Range   Lipase 23 11 - 51 U/L  Comprehensive metabolic panel     Status: None   Collection Time: 02/28/24  2:34 PM  Result Value Ref Range   Sodium 135 135 - 145 mmol/L   Potassium 4.5 3.5 - 5.1 mmol/L   Chloride 103 98 - 111 mmol/L   CO2 23 22 - 32 mmol/L   Glucose, Bld 87 70 - 99 mg/dL   BUN 8 6 - 20 mg/dL   Creatinine, Ser 9.27 0.44 - 1.00 mg/dL   Calcium 9.9 8.9 - 89.6 mg/dL   Total Protein 7.9 6.5 - 8.1 g/dL   Albumin 4.4 3.5 - 5.0 g/dL   AST 30 15 - 41 U/L   ALT 12 0 - 44 U/L   Alkaline Phosphatase 60 38 - 126 U/L   Total Bilirubin 0.5 0.0 - 1.2 mg/dL   GFR, Estimated >39 >39 mL/min   Anion gap 10 5 - 15   CBC     Status: Abnormal   Collection Time: 02/28/24  2:34 PM  Result Value Ref Range   WBC 5.9 4.0 - 10.5 K/uL   RBC 3.74 (L) 3.87 - 5.11 MIL/uL   Hemoglobin 11.3 (L) 12.0 - 15.0 g/dL   HCT 65.9 (L) 63.9 - 53.9 %   MCV 90.9 80.0 - 100.0 fL   MCH 30.2 26.0 - 34.0 pg   MCHC 33.2 30.0 - 36.0 g/dL   RDW 87.2 88.4 - 84.4 %   Platelets 275 150 - 400 K/uL   nRBC 0.0 0.0 - 0.2 %  Urinalysis, Routine w reflex microscopic -Urine, Clean Catch     Status: Abnormal   Collection Time: 02/28/24  3:25 PM  Result Value Ref Range   Color, Urine YELLOW YELLOW   APPearance CLEAR CLEAR   Specific Gravity, Urine 1.022 1.005 - 1.030   pH 7.5 5.0 - 8.0   Glucose, UA NEGATIVE NEGATIVE mg/dL   Hgb urine dipstick LARGE (A) NEGATIVE   Bilirubin Urine NEGATIVE NEGATIVE   Ketones, ur NEGATIVE NEGATIVE mg/dL   Protein, ur NEGATIVE NEGATIVE mg/dL   Nitrite NEGATIVE NEGATIVE   Leukocytes,Ua NEGATIVE NEGATIVE   RBC / HPF 6-10 0 - 5 RBC/hpf   WBC, UA 0-5 0 - 5 WBC/hpf   Bacteria, UA RARE (A) NONE SEEN   Squamous Epithelial / HPF 0-5 0 - 5 /HPF   Mucus PRESENT   Pregnancy, urine     Status: Abnormal   Collection Time: 02/28/24  3:25 PM  Result Value Ref Range   Preg Test, Ur POSITIVE (A) NEGATIVE  Wet prep, genital     Status: Abnormal   Collection Time: 02/28/24  5:32 PM  Result Value Ref Range   Yeast Wet Prep HPF POC NONE SEEN NONE SEEN   Trich, Wet Prep NONE SEEN NONE SEEN   Clue Cells Wet Prep HPF POC NONE SEEN NONE SEEN   WBC, Wet Prep HPF POC >=10 (A) <10   Sperm NONE SEEN   hCG, quantitative, pregnancy     Status: Abnormal  Collection Time: 02/28/24  6:22 PM  Result Value Ref Range   hCG, Beta Chain, Quant, S 2,286 (H) <5 mIU/mL   US  OB LESS THAN 14 WEEKS WITH OB TRANSVAGINAL Result Date: 02/28/2024 CLINICAL DATA:  Vaginal bleeding EXAM: OBSTETRIC <14 WK US  AND TRANSVAGINAL OB US  TECHNIQUE: Both transabdominal and transvaginal ultrasound examinations were performed for complete evaluation  of the gestation as well as the maternal uterus, adnexal regions, and pelvic cul-de-sac. Transvaginal technique was performed to assess early pregnancy. COMPARISON:  None Available. FINDINGS: Intrauterine gestational sac: None Yolk sac:  Not Visualized. Embryo:  Not Visualized. Maternal uterus/adnexae: Heterogenous echogenic soft tissue mass in the right adnexa, appears to be abutting the right ovary rather than arising from it, this measures 3 x 2.5 x 3.1 cm and contains peripheral vascularity. Small volume free fluid. IMPRESSION: No IUP identified. Heterogenous 3.1 cm soft tissue mass in the right adnexa abutting the right ovary with imaging features suspicious for right adnexal ectopic pregnancy. Critical Value/emergent results were called by telephone at the time of interpretation on 02/28/2024 at 6:03 pm to provider ADAM CURATOLO , who verbally acknowledged these results. Electronically Signed   By: Luke Bun M.D.   On: 02/28/2024 18:03    MDM Methotrexate  Coordination of Follow Up Care Assessment and Plan  28 year old Ectopic Pregnancy   -Discussed US  findings and options for treatment including methotrexate and surgical removal. -Reviewed Methotrexate treatment including: *Risks of methotrexate including failure requiring repeat dosing or eventual surgery.  *Side effects of photosensitivity & GI upset were discussed. *The need to avoid direct sunlight, NSAIDs,  and abstain from alcohol for two weeks and unprotected sexual intercourse until hCG less than assay.  *Instructed to discontinue any MVI with folic acid as well as decrease nutritional intake of folic acid. *Reviewed need for frequent return visits to monitor lab values. *Risk for ectopic rupture.  -Briefly discussed surgical intervention for removal of pregnancy.  Informed that surgeon would have to discuss r/b, but that surgery itself is considered invasive and that she would likely require removal of fallopian tube.   -Patient offered and declines speaking to surgeon and opts to proceed with methotrexate.   -Strict return/ectopic precautions reviewed.   -She has no history of hepatic or renal dysfunction, has normal BUN/Cr/platelets.  She is felt to be reliable for follow-up.  -?She understands to follow up on D4 (Monday) and D7 (Thursday) for repeat BHCG and was the instruction sheet was placed in her AVS.  Day 0/1 Day 4 Day 7  Sunday Wednesday Saturday  Monday Thursday Sunday  Tuesday Friday Monday  Wednesday Saturday Tuesday  Thursday Sunday Wednesday  Friday Monday Thursday  Saturday Tuesday Friday    -Methotrexate ordered.  Jill Peterson 02/28/2024, 8:51 PM   Reassessment (10:35 PM) -Methotrexate given. -Discussed follow up in MAU on Monday Oct 27th at 1600. Appt scheduled. -Provider back to bedside and patient without further questions. -Reiterated precautions. -Encouraged to call primary office or return to MAU if symptoms worsen or with the onset of new symptoms.  Jill LITTIE Duncans MSN, CNM Advanced Practice Provider, [facility] [employer]

## 2024-02-29 LAB — ABO/RH: ABO/RH(D): A POS

## 2024-03-02 ENCOUNTER — Inpatient Hospital Stay (HOSPITAL_COMMUNITY): Admission: AD | Admit: 2024-03-02 | Payer: Self-pay

## 2024-03-02 ENCOUNTER — Inpatient Hospital Stay (HOSPITAL_COMMUNITY): Payer: Self-pay | Attending: Obstetrics and Gynecology

## 2024-03-02 ENCOUNTER — Inpatient Hospital Stay (HOSPITAL_COMMUNITY)
Admission: AD | Admit: 2024-03-02 | Discharge: 2024-03-02 | Disposition: A | Discharge status: Home / Self Care | Payer: Self-pay | Attending: Obstetrics and Gynecology | Admitting: Obstetrics and Gynecology

## 2024-03-02 DIAGNOSIS — O00101 Right tubal pregnancy without intrauterine pregnancy: Secondary | ICD-10-CM

## 2024-03-02 DIAGNOSIS — Z3A01 Less than 8 weeks gestation of pregnancy: Secondary | ICD-10-CM

## 2024-03-02 DIAGNOSIS — O009 Unspecified ectopic pregnancy without intrauterine pregnancy: Secondary | ICD-10-CM | POA: Insufficient documentation

## 2024-03-02 LAB — HCG, QUANTITATIVE, PREGNANCY: hCG, Beta Chain, Quant, S: 2572 m[IU]/mL — ABNORMAL HIGH (ref <5)

## 2024-03-02 LAB — GC/CHLAMYDIA PROBE AMP (~~LOC~~) NOT AT ARMC
Chlamydia: NEGATIVE
Comment: NEGATIVE
Comment: NORMAL
Neisseria Gonorrhea: NEGATIVE

## 2024-03-02 NOTE — MAU Note (Signed)
 Jill Peterson is a 28 y.o. at [redacted]w[redacted]d here in MAU reporting: today is her day 4 post methotrexate f/u. had some cramping and bleeding off and on over the weekend.  Bleeding is light, only wearing a panty liner.   Onset of complaint: ongoing Pain score: mile Vitals:   03/02/24 1621  BP: 138/85  Pulse: 100  Resp: 16  Temp: 98.9 F (37.2 C)  SpO2: 100%      Lab orders placed from triage:  HCG

## 2024-03-02 NOTE — MAU Provider Note (Signed)
 History     [id number]  Arrival date and time: 03/02/24 1552    Chief Complaint  Patient presents with   Follow-up     HPI Jill Peterson is a 28 y.o. at [redacted]w[redacted]d with PMHx notable for recently diagnosed ectopic pregnancy, who presents for f/u HCG level.   Patient seen in MAU on 02/28/2024, diagnosed with ectopic that day and treated with methotrexate Given dose later in day on Friday, instructed to return to MAU today due to inability to contact clinic to schedule visit over the weekend  No significant change in symptoms since being seen four days prior Continues to have intermittent cramping and vaginal bleeding No significant change in her symptoms since last being seen   --/--/A POS Performed at [facility], [address] [place], [place] [address]  (10/24 [place])  OB History     Gravida  1   Para      Term      Preterm      AB      Living         SAB      IAB      Ectopic      Multiple      Live Births              Past Medical History:  Diagnosis Date   Medical history non-contributory     Past Surgical History:  Procedure Laterality Date   NO PAST SURGERIES      No family history on file.  Social History   Socioeconomic History   Marital status: Single    Spouse name: Not on file   Number of children: Not on file   Years of education: Not on file   Highest education level: Not on file  Occupational History   Not on file  Tobacco Use   Smoking status: Never   Smokeless tobacco: Never  Vaping Use   Vaping status: Never Used  Substance and Sexual Activity   Alcohol use: Yes    Comment: few drinks a month last was 2 weeks ago   Drug use: Never   Sexual activity: Yes  Other Topics Concern   Not on file  Social History Narrative   Not on file   Social Drivers of Health   Financial Resource Strain: Not on file  Food Insecurity: Not on file  Transportation Needs: Not on file  Physical  Activity: Not on file  Stress: Not on file  Social Connections: Not on file  Intimate Partner Violence: Not on file    No Known Allergies  No current facility-administered medications on file prior to encounter.   No current outpatient medications on file prior to encounter.     ROS Pertinent positives and negative per HPI, all others reviewed and negative  Physical Exam   BP 138/85 (BP Location: Right Arm)   Pulse 100   Temp 98.9 F (37.2 C) (Oral)   Resp 16   Ht 5' 2 (1.575 m)   Wt 61 kg   LMP 01/14/2024   SpO2 100%   BMI 24.60 kg/m   Patient Vitals for the past 24 hrs:  BP Temp Temp src Pulse Resp SpO2 Height Weight  03/02/24 1621 138/85 98.9 F (37.2 C) Oral 100 16 100 % 5' 2 (1.575 m) 61 kg    Physical Exam Vitals reviewed.  Constitutional:      General: She is not in acute distress.    Appearance:  She is well-developed. She is not diaphoretic.  Eyes:     General: No scleral icterus. Pulmonary:     Effort: Pulmonary effort is normal. No respiratory distress.  Skin:    General: Skin is warm and dry.  Neurological:     Mental Status: She is alert.     Coordination: Coordination normal.      Cervical Exam    Bedside Ultrasound Not performed.  My interpretation: n/a   Labs Results for orders placed or performed during the hospital encounter of 03/02/24 (from the past 24 hours)  hCG, quantitative, pregnancy     Status: Abnormal   Collection Time: 03/02/24  4:31 PM  Result Value Ref Range   hCG, Beta Chain, Quant, S 2,572 (H) <5 mIU/mL    Imaging No results found.  MAU Course  Procedures Lab Orders         hCG, quantitative, pregnancy    No orders of the defined types were placed in this encounter.  Imaging Orders  No imaging studies ordered today    MDM Moderate (Level 3-4)  Assessment and Plan  #Ectopic pregnancy #[redacted] weeks gestation of pregnancy Discussed with patient given no change in symptoms no further workup needed, and  that visit today was primarily to ensure we could obtain day 4. Resulted after patient left, very minimal increase is not uncommon. Discussed I will send a message to [facility] office (2 min from her house) to get stat hcg drawn on day 7 which is Thursday. She works [phone], requests visit at 1600. Reviewed importance of trending values to ensure full treatment and that it could be a lengthy process.    Dispo: discharged to home in stable condition   Donnice CHRISTELLA Carolus, MD/MPH 03/02/24 6:48 PM  Allergies as of 03/02/2024   No Known Allergies      Medication List    You have not been prescribed any medications.

## 2024-03-03 ENCOUNTER — Telehealth (HOSPITAL_BASED_OUTPATIENT_CLINIC_OR_DEPARTMENT_OTHER): Payer: Self-pay

## 2024-03-03 NOTE — Telephone Encounter (Signed)
 A nurse from MAU called and stated that a message was sent here, to get this pt set up for follow up HCG draw in 7 days, which will be Thursday and the patient has not heard anything.

## 2024-03-04 ENCOUNTER — Other Ambulatory Visit (HOSPITAL_BASED_OUTPATIENT_CLINIC_OR_DEPARTMENT_OTHER): Payer: Self-pay | Admitting: Obstetrics & Gynecology

## 2024-03-04 DIAGNOSIS — O00101 Right tubal pregnancy without intrauterine pregnancy: Secondary | ICD-10-CM

## 2024-03-04 DIAGNOSIS — Z79631 Long term (current) use of antimetabolite agent: Secondary | ICD-10-CM

## 2024-03-04 NOTE — Telephone Encounter (Signed)
 Patient is scheduled for lab appointment 03/05/2024 at 3:30 am.

## 2024-03-05 ENCOUNTER — Other Ambulatory Visit (HOSPITAL_BASED_OUTPATIENT_CLINIC_OR_DEPARTMENT_OTHER): Payer: Self-pay

## 2024-03-05 DIAGNOSIS — O00101 Right tubal pregnancy without intrauterine pregnancy: Secondary | ICD-10-CM

## 2024-03-05 DIAGNOSIS — Z79631 Long term (current) use of antimetabolite agent: Secondary | ICD-10-CM

## 2024-03-06 ENCOUNTER — Ambulatory Visit (HOSPITAL_BASED_OUTPATIENT_CLINIC_OR_DEPARTMENT_OTHER): Payer: Self-pay | Admitting: Obstetrics & Gynecology

## 2024-03-06 ENCOUNTER — Telehealth (HOSPITAL_BASED_OUTPATIENT_CLINIC_OR_DEPARTMENT_OTHER): Payer: Self-pay

## 2024-03-06 DIAGNOSIS — O00101 Right tubal pregnancy without intrauterine pregnancy: Secondary | ICD-10-CM

## 2024-03-06 LAB — BETA HCG QUANT (REF LAB): hCG Quant: 1554 m[IU]/mL

## 2024-03-06 NOTE — Telephone Encounter (Signed)
 Spoke with patient. Advised Jill Peterson, CNM reviewed her HCG results and they are dropping appropriately. Will need repeat HCG levels in office next week. Lab appointments scheduled for 03/09/24 and 03/12/24 at 4 pm. Patient is agreeable to dates and times. Orders placed.

## 2024-03-09 ENCOUNTER — Other Ambulatory Visit (HOSPITAL_BASED_OUTPATIENT_CLINIC_OR_DEPARTMENT_OTHER): Payer: Self-pay

## 2024-03-10 ENCOUNTER — Ambulatory Visit (HOSPITAL_BASED_OUTPATIENT_CLINIC_OR_DEPARTMENT_OTHER): Payer: Self-pay | Admitting: Certified Nurse Midwife

## 2024-03-10 DIAGNOSIS — O009 Unspecified ectopic pregnancy without intrauterine pregnancy: Secondary | ICD-10-CM

## 2024-03-10 LAB — BETA HCG QUANT (REF LAB): hCG Quant: 786 m[IU]/mL

## 2024-03-12 ENCOUNTER — Other Ambulatory Visit (HOSPITAL_BASED_OUTPATIENT_CLINIC_OR_DEPARTMENT_OTHER): Payer: Self-pay

## 2024-03-13 LAB — BETA HCG QUANT (REF LAB): hCG Quant: 546 m[IU]/mL

## 2024-03-16 ENCOUNTER — Telehealth (HOSPITAL_BASED_OUTPATIENT_CLINIC_OR_DEPARTMENT_OTHER): Payer: Self-pay

## 2024-03-16 NOTE — Telephone Encounter (Signed)
 Spoke with patient. Lab appointment for beta hcg quant scheduled for 11/13 at 3:30 pm. Patient is agreeable to date and time.

## 2024-03-18 ENCOUNTER — Other Ambulatory Visit (HOSPITAL_BASED_OUTPATIENT_CLINIC_OR_DEPARTMENT_OTHER): Payer: Self-pay | Admitting: Obstetrics & Gynecology

## 2024-03-18 DIAGNOSIS — O00101 Right tubal pregnancy without intrauterine pregnancy: Secondary | ICD-10-CM

## 2024-03-18 DIAGNOSIS — Z79631 Long term (current) use of antimetabolite agent: Secondary | ICD-10-CM

## 2024-03-19 ENCOUNTER — Other Ambulatory Visit (HOSPITAL_BASED_OUTPATIENT_CLINIC_OR_DEPARTMENT_OTHER): Payer: Self-pay

## 2024-03-19 DIAGNOSIS — O00101 Right tubal pregnancy without intrauterine pregnancy: Secondary | ICD-10-CM

## 2024-03-20 ENCOUNTER — Telehealth (HOSPITAL_BASED_OUTPATIENT_CLINIC_OR_DEPARTMENT_OTHER): Payer: Self-pay

## 2024-03-20 ENCOUNTER — Ambulatory Visit (HOSPITAL_BASED_OUTPATIENT_CLINIC_OR_DEPARTMENT_OTHER): Payer: Self-pay | Admitting: Obstetrics & Gynecology

## 2024-03-20 DIAGNOSIS — Z79631 Long term (current) use of antimetabolite agent: Secondary | ICD-10-CM

## 2024-03-20 DIAGNOSIS — O00101 Right tubal pregnancy without intrauterine pregnancy: Secondary | ICD-10-CM

## 2024-03-20 LAB — BETA HCG QUANT (REF LAB): hCG Quant: 186 m[IU]/mL

## 2024-03-20 NOTE — Telephone Encounter (Signed)
 Left message to call Jill Peterson at [phone]. Called patient to notify her that her HCG level is coming down appropriately. Needs to repeat 1 week.

## 2024-03-23 NOTE — Telephone Encounter (Signed)
 Left message to call Nayab Aten at [phone].

## 2024-03-25 NOTE — Telephone Encounter (Signed)
 Spoke with patient. Lab appointment scheduled for 03/26/2024 at 3:30 pm.

## 2024-03-26 ENCOUNTER — Other Ambulatory Visit (HOSPITAL_BASED_OUTPATIENT_CLINIC_OR_DEPARTMENT_OTHER): Payer: Self-pay

## 2024-03-27 ENCOUNTER — Telehealth (HOSPITAL_BASED_OUTPATIENT_CLINIC_OR_DEPARTMENT_OTHER): Payer: Self-pay

## 2024-03-27 LAB — BETA HCG QUANT (REF LAB): hCG Quant: 54 m[IU]/mL

## 2024-03-27 NOTE — Telephone Encounter (Signed)
 Spoke with patient. Advised beta hcg results reviewed with Dr.Miller and it has appropriately come down to 54. Patient will need a repeat beta hcg level in 2 weeks. Patient denies any pain, fever, or chills. Reports she is still having very light bleeding. Reviewed with Dr.Miller and this is okay as long as she is not having any pain, fever, or chills. Patient is also asking when she should expect to have her next regular menses. Advised reviewed with Dr.Miller and once her beta hcg has returned to normal her bleeding should have stopped. Once that occurs she should then have her menses within that month. Patient verbalizes understanding. Lab appointment scheduled for 04/09/2024 at 4 pm. Patient is agreeable to date and time.

## 2024-04-09 ENCOUNTER — Other Ambulatory Visit (HOSPITAL_BASED_OUTPATIENT_CLINIC_OR_DEPARTMENT_OTHER): Payer: Self-pay

## 2024-04-09 DIAGNOSIS — O00101 Right tubal pregnancy without intrauterine pregnancy: Secondary | ICD-10-CM | POA: Diagnosis not present

## 2024-04-09 DIAGNOSIS — Z79631 Long term (current) use of antimetabolite agent: Secondary | ICD-10-CM | POA: Diagnosis not present

## 2024-04-10 LAB — BETA HCG QUANT (REF LAB): hCG Quant: 2 m[IU]/mL
